# Patient Record
Sex: Female | Born: 1950 | Race: White | Hispanic: No | Marital: Single | Smoking: Never smoker
Health system: Southern US, Community
[De-identification: ages and names within clinical notes are randomized; demographics above are authoritative.]

## PROBLEM LIST (undated history)

## (undated) DIAGNOSIS — E7800 Pure hypercholesterolemia, unspecified: Secondary | ICD-10-CM

## (undated) DIAGNOSIS — I10 Essential (primary) hypertension: Secondary | ICD-10-CM

## (undated) DIAGNOSIS — E119 Type 2 diabetes mellitus without complications: Secondary | ICD-10-CM

## (undated) HISTORY — PX: BREAST SURGERY: SHX581

## (undated) HISTORY — PX: TONSILLECTOMY: SUR1361

---

## 2004-05-05 ENCOUNTER — Ambulatory Visit: Payer: Self-pay | Admitting: Internal Medicine

## 2004-05-13 ENCOUNTER — Ambulatory Visit: Payer: Self-pay | Admitting: Unknown Physician Specialty

## 2006-09-07 ENCOUNTER — Ambulatory Visit: Payer: Self-pay

## 2009-11-18 ENCOUNTER — Ambulatory Visit: Payer: Self-pay | Admitting: Family Medicine

## 2012-03-14 ENCOUNTER — Ambulatory Visit: Payer: Self-pay | Admitting: Family Medicine

## 2013-02-26 ENCOUNTER — Ambulatory Visit: Payer: Self-pay | Admitting: Internal Medicine

## 2013-02-26 LAB — URINALYSIS, COMPLETE
Bilirubin,UR: NEGATIVE
Glucose,UR: NEGATIVE mg/dL (ref 0–75)
Ketone: NEGATIVE
Nitrite: NEGATIVE
Ph: 6 (ref 4.5–8.0)
Protein: 30
Specific Gravity: 1.025 (ref 1.003–1.030)
Squamous Epithelial: NONE SEEN

## 2013-09-12 IMAGING — US ULTRASOUND RIGHT BREAST
1 series · 13 of 17 positions shown · non-contrast
Comparison: 09/07/2006.

REASON FOR EXAM: RT BRST LIPOMA 2 OCLOCK AND YRLY CAT 2
COMMENTS:

PROCEDURE:     US  - US BREAST RIGHT  - March 14, 2012  [DATE]
RESULT:

[Series 1: ultrasound right breast · 0.07mm/px · 17 acquisitions, 13 frames shown]
[im 1/17]
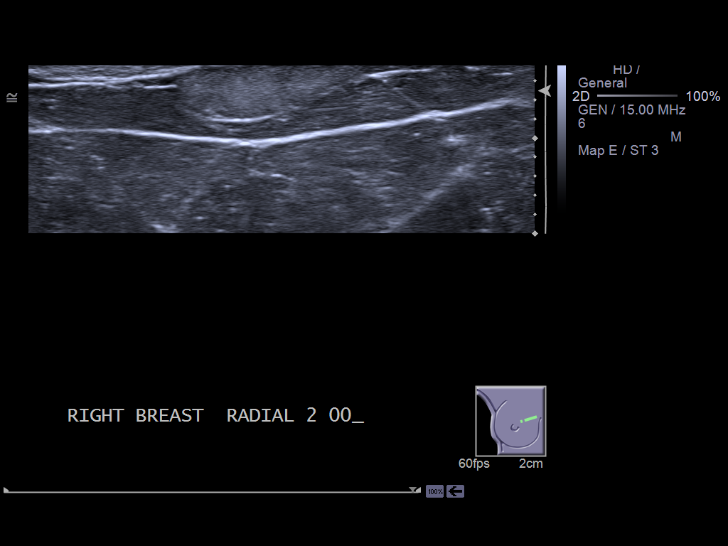
[im 2/17]
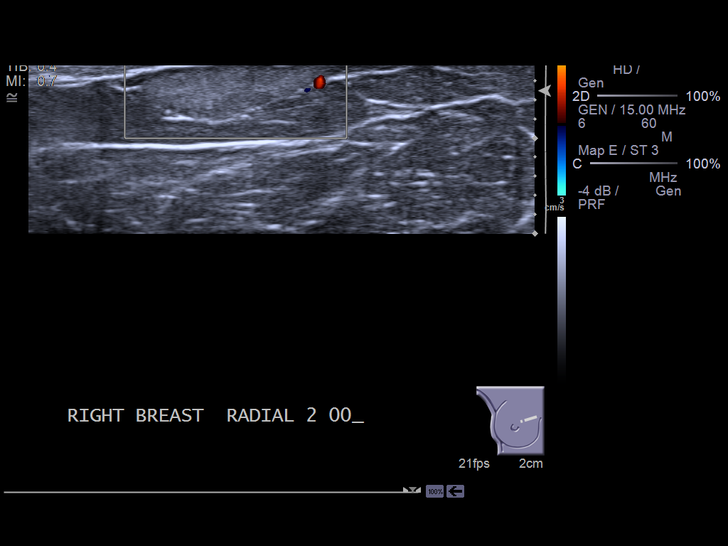
[im 4/17]
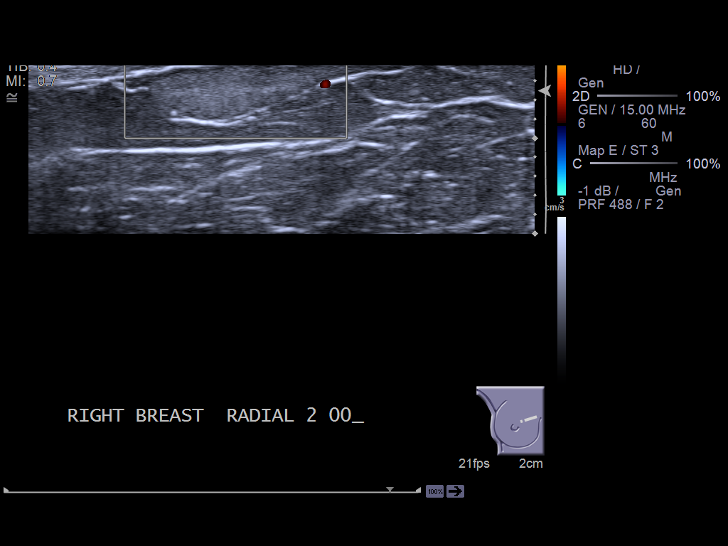
[im 5/17]
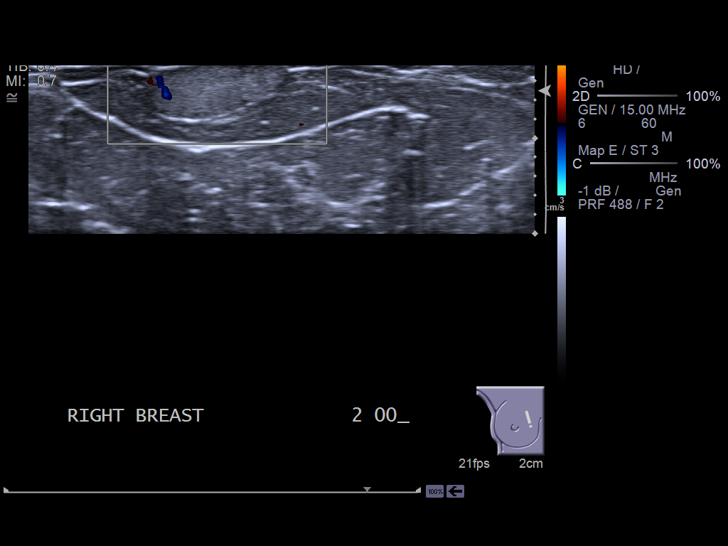
[im 6/17]
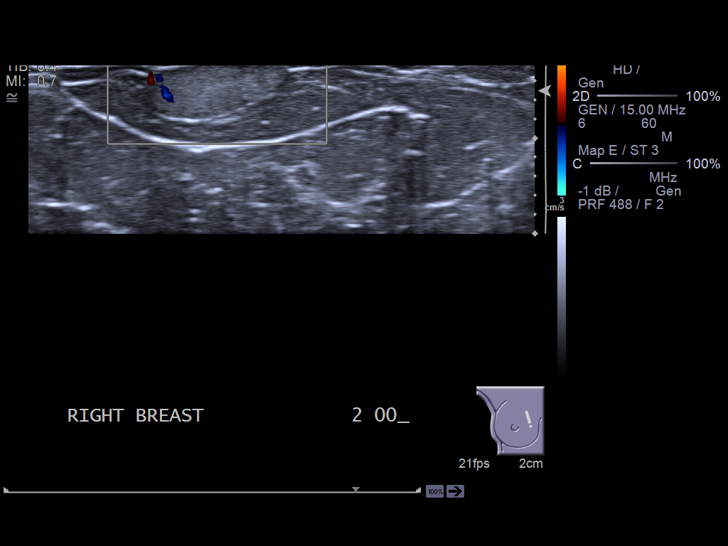
[im 8/17]
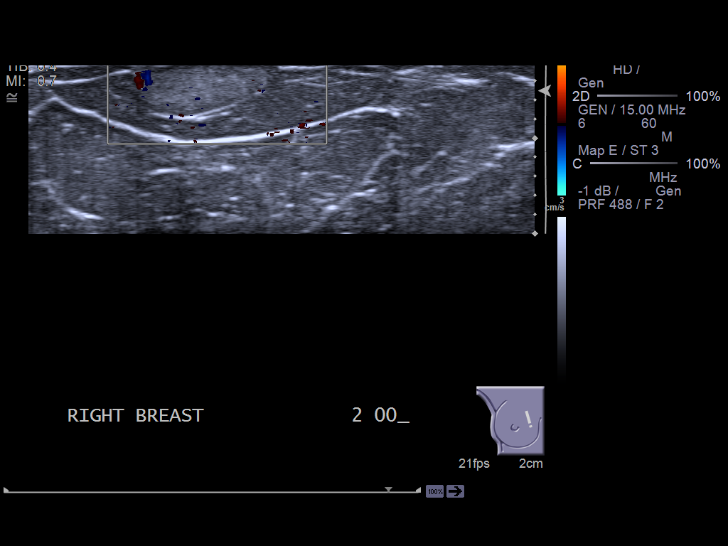
[im 9/17]
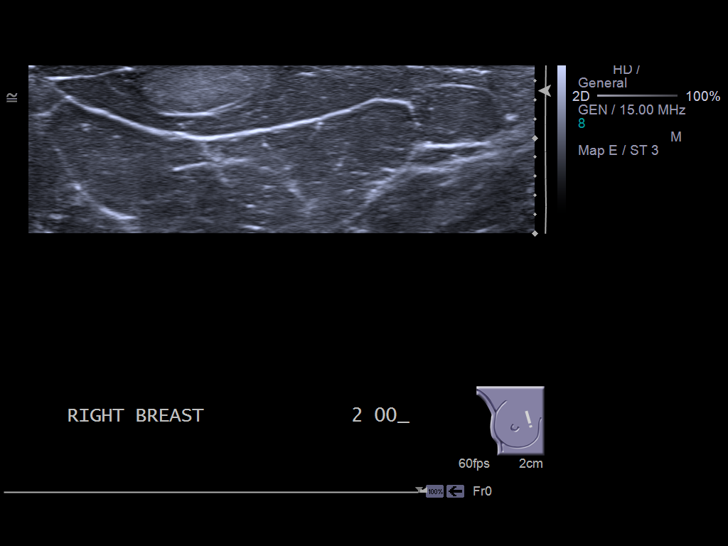
[im 10/17]
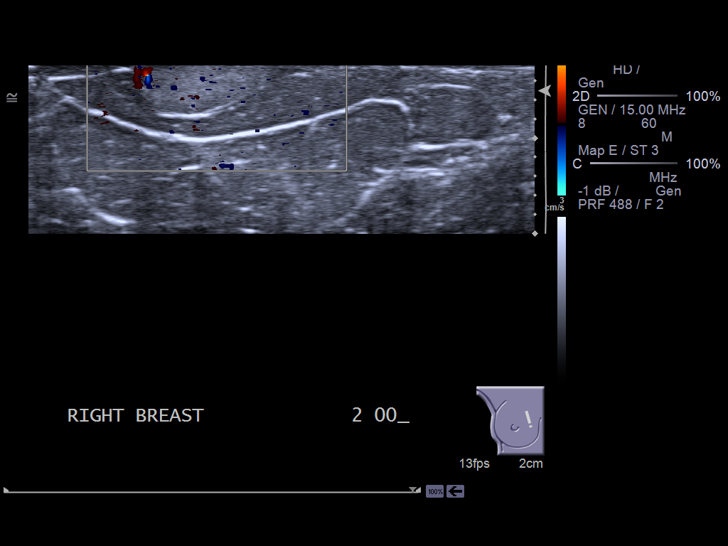
[im 12/17]
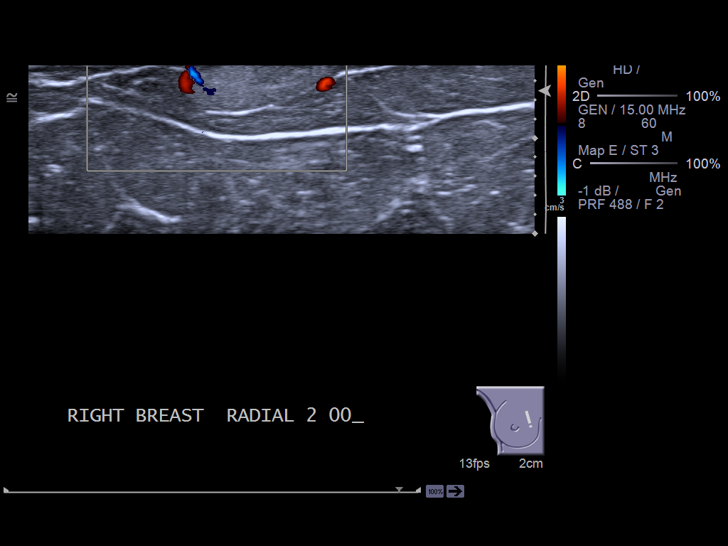
[im 13/17]
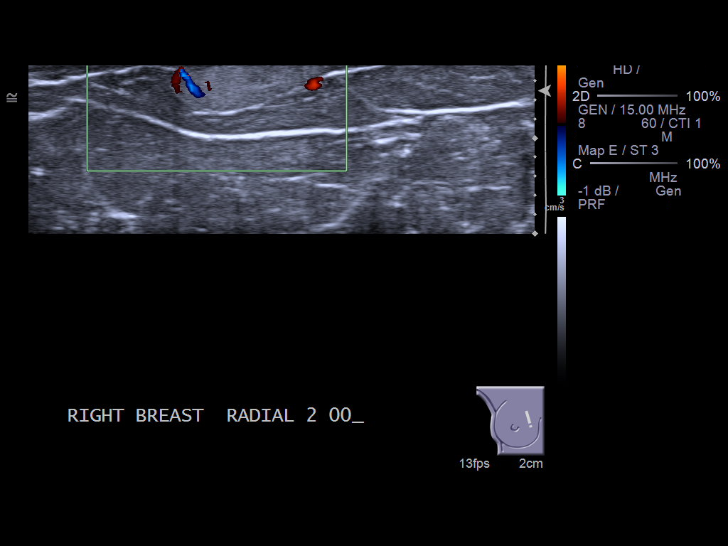
[im 14/17]
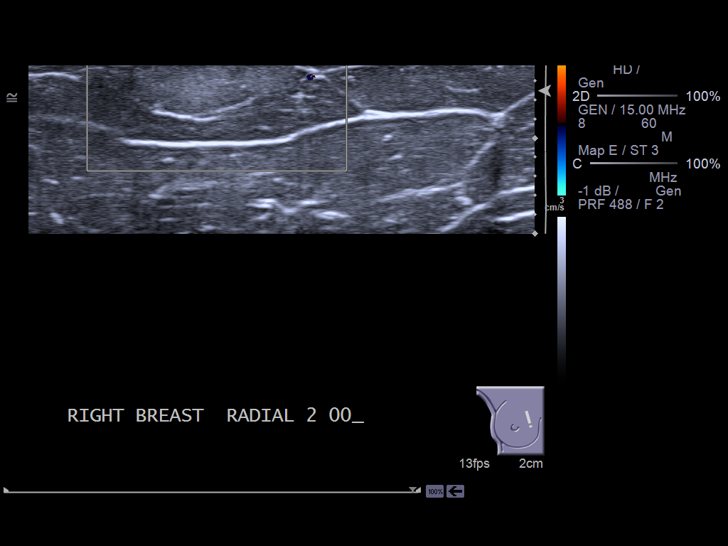
[im 16/17]
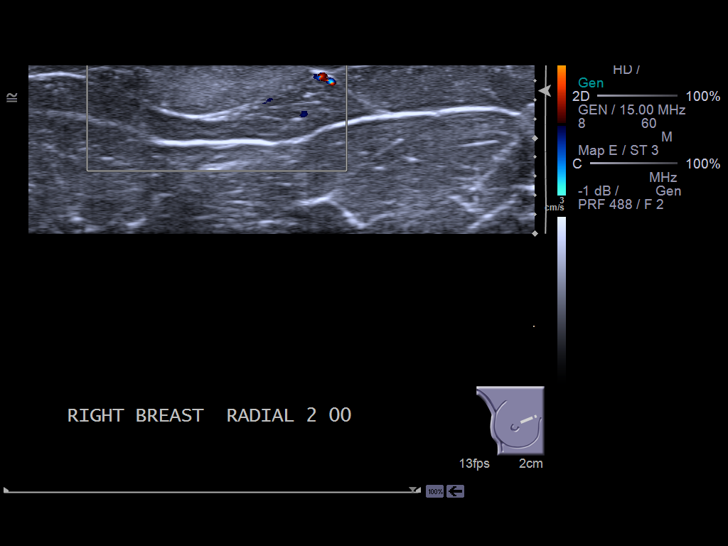
[im 17/17]
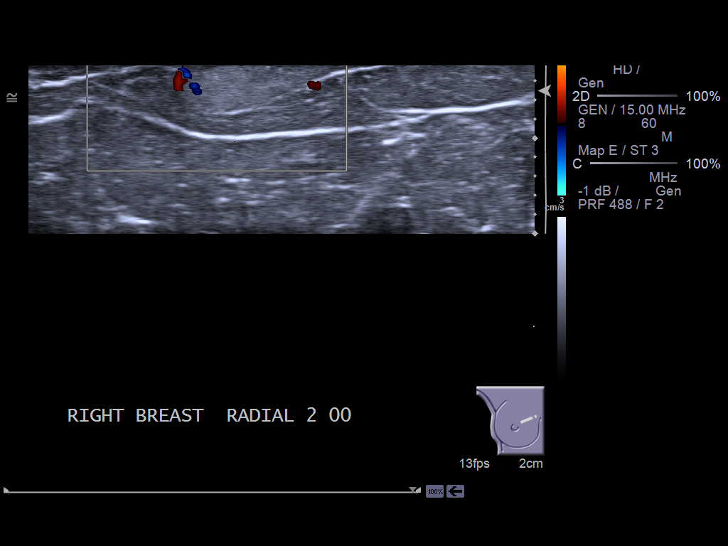

[13 of 17 positions shown; findings below may reference images not displayed]

FINDINGS: The breast tissue is predominantly fatty. No suspicious masses or
calcifications are identified. No areas of architectural distortion. A
palpable marker was placed along the anteromedial right breast secondary to
reported palpable abnormality in this region. Spot compression magnification
views were performed of this region. No mass or focal asymmetry was
identified.

Real-time ultrasound was performed of the medial right breast at
approximately 2 o'clock secondary to the reported palpable abnormality at
this site. At this location there is an oval well-circumscribed,
homogeneously hyperechoic mass. There is internal color Doppler flow witin
the periphery of the mass. It measures 1.9 x 1.5 x 0.6 cm. This mass is
consistent with the imaging appearance of a benign etiology such as a
lipoma.
IMPRESSION: BI-RADS: Category 2 - Benign Finding.

1. There is a mass at the site of reported palpable abnormality, which
demonstrates an imaging appearance consistent with a lipoma. However, if
this palpable abnormality remains clinically suspicious, surgical
consultation would be suggested.
2. Recommend continued annual screening mammography.

[REDACTED]

## 2015-07-17 ENCOUNTER — Ambulatory Visit
Admission: EM | Admit: 2015-07-17 | Discharge: 2015-07-17 | Disposition: A | Discharge status: Home / Self Care | Payer: BC Managed Care – PPO | Attending: Family Medicine | Admitting: Family Medicine

## 2015-07-17 DIAGNOSIS — T50901A Poisoning by unspecified drugs, medicaments and biological substances, accidental (unintentional), initial encounter: Secondary | ICD-10-CM

## 2015-07-17 HISTORY — DX: Pure hypercholesterolemia, unspecified: E78.00

## 2015-07-17 HISTORY — DX: Type 2 diabetes mellitus without complications: E11.9

## 2015-07-17 HISTORY — DX: Essential (primary) hypertension: I10

## 2015-07-17 LAB — GLUCOSE, CAPILLARY
GLUCOSE-CAPILLARY: 391 mg/dL — AB (ref 65–99)
GLUCOSE-CAPILLARY: 342 mg/dL — AB (ref 65–99)
Glucose-Capillary: 319 mg/dL — ABNORMAL HIGH (ref 65–99)
Glucose-Capillary: 307 mg/dL — ABNORMAL HIGH (ref 65–99)

## 2015-07-17 NOTE — ED Provider Notes (Signed)
Patient presents today after taking NovoLog 28 units at around 740 instead of her Lantus. Patient states that she normally takes about 9 units of her Novolog. She immediately recognized the mistake and started drinking Gatorade and taking glucose tablets. She has no symptoms. Her blood sugar here in the office is 197. Prior to taking the Novolog her blood sugars 155. She states that her blood sugars normally run about 200 in the morning. She denies this mistake happening in the past before.  ROS: Negative except mentioned above.  Vitals as per Epic GENERAL: NAD HEENT: no pharyngeal erythema, no exudate RESP: CTA B CARD: RRR NEURO: CN II-XII grossly intact   A/P: Medication Overdose with Novolog-discussed case with the ER physician who suggested that they normally would watch the patient for about 4 hrs. patient sugars remained in the 300s in the office for 4 hours. Patient did not have any symptoms while in the office. I recommended that she check her blood sugar every 2 hours or so today just so she can get an idea of how to proceed with taking her medications are today. If she has any problems she will seek medical attention. I have instructed her to take her Lantus this evening and also her metformin. She'll decide based on her blood sugars whether she needs to take her NovoLog with her evening meal. Patient very appreciative and will follow up with her primary care physician as instructed.  Jolene ProvostKirtida Tahni Porchia, MD 07/17/15 1150

## 2015-07-17 NOTE — ED Notes (Addendum)
CBG 342 mg/dL. Dr. Allena KatzPatel aware. Patient continues to eat crackers and peanut butter and drink fluids

## 2015-07-17 NOTE — ED Notes (Signed)
Pt given ice water, crackers and peanut butter , informed will recheck blood glucose in an hour.

## 2015-07-17 NOTE — ED Notes (Signed)
Accidentally took Novalog 28 units at 0740hrs. Immediately recognized had geven self wrong insulin, began drinking Gatorade. Did blood sugar prior to any insulin and was 155 mg/dL. On arrival, accucheck was 197 mg/dL.  States "I feel fine"

## 2020-04-02 ENCOUNTER — Other Ambulatory Visit: Payer: Self-pay | Admitting: Nurse Practitioner

## 2020-04-02 DIAGNOSIS — Z1231 Encounter for screening mammogram for malignant neoplasm of breast: Secondary | ICD-10-CM

## 2022-09-12 ENCOUNTER — Ambulatory Visit
Admission: EM | Admit: 2022-09-12 | Discharge: 2022-09-12 | Disposition: A | Discharge status: Home / Self Care | Payer: BC Managed Care – PPO | Attending: Family Medicine | Admitting: Family Medicine

## 2022-09-12 DIAGNOSIS — N3001 Acute cystitis with hematuria: Secondary | ICD-10-CM

## 2022-09-12 LAB — URINALYSIS, ROUTINE W REFLEX MICROSCOPIC
Glucose, UA: NEGATIVE mg/dL
Ketones, ur: NEGATIVE mg/dL
Nitrite: NEGATIVE
Protein, ur: 100 mg/dL — AB
Specific Gravity, Urine: 1.025 (ref 1.005–1.030)
pH: 5.5 (ref 5.0–8.0)

## 2022-09-12 LAB — URINALYSIS, MICROSCOPIC (REFLEX): WBC, UA: >50 WBC/hpf (ref 0–5)

## 2022-09-12 MED ORDER — CEPHALEXIN 500 MG PO CAPS: 500.0000 mg | ORAL_CAPSULE | Freq: Four times a day (QID) | ORAL | 0 refills | Status: AC

## 2022-09-12 MED ORDER — FLUCONAZOLE 150 MG PO TABS: 150.0000 mg | ORAL_TABLET | ORAL | 0 refills | Status: AC

## 2022-09-12 NOTE — Discharge Instructions (Signed)
You have a urinary tract infection. Stop by the pharmacy to pick up your prescriptions.  I sent your urine off for culture to be sure that we are covering you with the correct antibiotics.  Someone may contact you to tell you that we need to change antibiotics.

## 2022-09-12 NOTE — ED Provider Notes (Signed)
[facility]    CSN: [id number] Arrival date & time: 09/12/22  0900      History   Chief Complaint Chief Complaint  Patient presents with   Urinary Frequency     HPI HPI Elizabeth Dodson is a 72 y.o. female.    Denton Meek presents for urinary frequency, dysuria, back pain and left lower abdominal pain.  Pain started a couple days ago but then this morning she woke up and went to the bathroom and had dysuria.  She got up again a couple hours later and have dysuria again so she came to the urgent care. No medications prior to arrival. There has been no fever, nausea or vomiting. Patient is postmenopausal.       Past Medical History:  Diagnosis Date   Diabetes mellitus without complication ([place])    Hypercholesteremia    Hypertension     There are no problems to display for this patient.   Past Surgical History:  Procedure Laterality Date   BREAST SURGERY     TONSILLECTOMY      OB History   No obstetric history on file.      Home Medications    Prior to Admission medications   Medication Sig Start Date End Date Taking? Authorizing Provider  cephALEXin (KEFLEX) 500 MG capsule Take 1 capsule (500 mg total) by mouth 4 (four) times daily. 09/12/22  Yes Tyiesha Brackney, DO  cetirizine (ZYRTEC) 10 MG tablet TAKE (1) TABLET BY MOUTH EVERY DAY 11/23/16  Yes [provider]  enalapril (VASOTEC) 5 MG tablet Take 5 mg by mouth daily.   Yes [provider]  FIASP FLEXTOUCH 100 UNIT/ML FlexTouch Pen SMARTSIG:16 Unit(s) SUB-Q Twice Daily   Yes [provider]  fluconazole (DIFLUCAN) 150 MG tablet Take 1 tablet (150 mg total) by mouth every 3 (three) days for 2 doses. 09/12/22 09/16/22 Yes Kelan Pritt, DO  hydrochlorothiazide (HYDRODIURIL) 25 MG tablet Take 25 mg by mouth daily.   Yes [provider]  insulin aspart (NOVOLOG) 100 UNIT/ML injection Inject into the skin 3 (three) times daily before meals.   Yes [provider]  insulin glargine (LANTUS) 100 UNIT/ML injection Inject 28 Units into the skin at bedtime.   Yes [provider]  metFORMIN (GLUMETZA) 1000 MG (MOD) 24 hr tablet Take 1,000 mg by mouth daily with breakfast.   Yes [provider]  metFORMIN (GLUMETZA) 1000 MG (MOD) 24 hr tablet Take 1,500 mg by mouth daily with breakfast.   Yes [provider]  montelukast (SINGULAIR) 10 MG tablet Take 1 tablet by mouth daily. 09/24/17  Yes [provider]  omeprazole (PRILOSEC) 20 MG capsule TAKE (1) CAPSULE BY MOUTH EVERY DAY 07/27/22  Yes [provider]    Family History Family History  Problem Relation Age of Onset   Heart failure Mother    Cancer Father     Social History Social History   Tobacco Use   Smoking status: Never  Vaping Use   Vaping Use: Never used  Substance Use Topics   Alcohol use: No     Allergies   Guaifenesin and Septra [sulfamethoxazole-trimethoprim]   Review of Systems Review of Systems: :negative unless otherwise stated in HPI.      Physical Exam Triage Vital Signs ED Triage Vitals  Enc Vitals Group     BP --      Pulse --      Resp --      Temp --  Temp src --      SpO2 --      Weight 09/12/22 0923 180 lb (81.6 kg)     Height --      Head Circumference --      Peak Flow --      Pain Score 09/12/22 0922 1     Pain Loc --      Pain Edu? --      Excl. in [place]? --    No data found.  Updated Vital Signs BP (!) 177/76 (BP Location: Right Arm)   Pulse 68   Temp 98.2 F (36.8 C) (Oral)   Resp 16   Wt 81.6 kg   SpO2 97%   BMI 28.19 kg/m   Visual Acuity Right Eye Distance:   Left Eye Distance:   Bilateral Distance:    Right Eye Near:   Left Eye Near:    Bilateral Near:     Physical Exam GEN: well appearing female in no acute distress  CVS: well perfused, regular rate  RESP: speaking in full sentences without pause  ABD: soft, non-tender, non-distended, no palpable masses   SKIN:  warm, dry    UC Treatments / Results  Labs (all labs ordered are listed, but only abnormal results are displayed) Labs Reviewed  URINALYSIS, ROUTINE W REFLEX MICROSCOPIC - Abnormal; Notable for the following components:      Result Value   APPearance HAZY (*)    Hgb urine dipstick LARGE (*)    Bilirubin Urine SMALL (*)    Protein, ur 100 (*)    Leukocytes,Ua MODERATE (*)    All other components within normal limits  URINALYSIS, MICROSCOPIC (REFLEX) - Abnormal; Notable for the following components:   Bacteria, UA MANY (*)    All other components within normal limits  URINE CULTURE    EKG   Radiology No results found.  Procedures Procedures (including critical care time)  Medications Ordered in UC Medications - No data to display  Initial Impression / Assessment and Plan / UC Course  I have reviewed the triage vital signs and the nursing notes.  Pertinent labs & imaging results that were available during my care of the patient were reviewed by me and considered in my medical decision making (see chart for details).     Acute cystitis:  Patient is a 72 y.o. female  who presents for 1 day of dysuria and urinary frequency.  Overall patient is well-appearing and afebrile.  UA consistent with acute cystitis. Hematuria supported on microscopy. Treat with Keflex 4 times daily for 5 days.  Urine culture obtained.  Follow-up sensitivities and change antibiotics, if needed. Return precautions including abdominal pain, fever, chills, nausea, or vomiting given. She has history of antibiotic associated yeast-infections. Diflucan given as well.   Discussed MDM, treatment plan and plan for follow-up with patient who agrees with plan.        Final Clinical Impressions(s) / UC Diagnoses   Final diagnoses:  Acute cystitis with hematuria     Discharge Instructions      You have a urinary tract infection. Stop by the pharmacy to pick up your prescriptions.  I sent your urine off  for culture to be sure that we are covering you with the correct antibiotics.  Someone may contact you to tell you that we need to change antibiotics.       ED Prescriptions     Medication Sig Dispense Auth. Provider   cephALEXin (KEFLEX) 500 MG capsule Take  1 capsule (500 mg total) by mouth 4 (four) times daily. 20 capsule Tekoa Hamor, DO   fluconazole (DIFLUCAN) 150 MG tablet Take 1 tablet (150 mg total) by mouth every 3 (three) days for 2 doses. 2 tablet Lyndee Hensen, DO      PDMP not reviewed this encounter.   Lyndee Hensen, DO 09/12/22 1007

## 2022-09-12 NOTE — ED Triage Notes (Signed)
Pt states that she's had pelvic pain for 3 days. Went to bathroom this morning and burned with urination. Hasn't taken anything for it.

## 2022-09-13 LAB — URINE CULTURE: Culture: NO GROWTH
